# Patient Record
Sex: Male | Born: 1950 | Race: White | Hispanic: No
Health system: Southern US, Community
[De-identification: ages and names within clinical notes are randomized; demographics above are authoritative.]

---

## 2009-06-26 ENCOUNTER — Ambulatory Visit: Payer: Self-pay | Admitting: Oncology

## 2009-06-26 ENCOUNTER — Emergency Department: Payer: Self-pay | Admitting: Emergency Medicine

## 2009-06-26 ENCOUNTER — Ambulatory Visit: Payer: Self-pay | Admitting: Internal Medicine

## 2009-06-27 ENCOUNTER — Emergency Department: Payer: Self-pay | Admitting: Emergency Medicine

## 2009-07-05 ENCOUNTER — Other Ambulatory Visit: Payer: Self-pay | Admitting: Internal Medicine

## 2009-07-09 ENCOUNTER — Other Ambulatory Visit: Payer: Self-pay | Admitting: Internal Medicine

## 2009-07-15 ENCOUNTER — Other Ambulatory Visit: Payer: Self-pay | Admitting: Internal Medicine

## 2009-07-22 ENCOUNTER — Other Ambulatory Visit: Payer: Self-pay | Admitting: Internal Medicine

## 2009-07-24 ENCOUNTER — Ambulatory Visit: Payer: Self-pay | Admitting: Oncology

## 2009-07-26 ENCOUNTER — Ambulatory Visit: Payer: Self-pay | Admitting: Oncology

## 2009-08-26 ENCOUNTER — Ambulatory Visit: Payer: Self-pay | Admitting: Oncology

## 2009-09-26 ENCOUNTER — Ambulatory Visit: Payer: Self-pay | Admitting: Oncology

## 2009-10-26 ENCOUNTER — Ambulatory Visit: Payer: Self-pay | Admitting: Oncology

## 2009-11-12 ENCOUNTER — Emergency Department: Payer: Self-pay | Admitting: Internal Medicine

## 2009-11-26 ENCOUNTER — Ambulatory Visit: Payer: Self-pay | Admitting: Oncology

## 2010-04-16 ENCOUNTER — Ambulatory Visit: Payer: Self-pay | Admitting: Oncology

## 2010-04-27 ENCOUNTER — Ambulatory Visit: Payer: Self-pay | Admitting: Oncology

## 2010-05-27 ENCOUNTER — Ambulatory Visit: Payer: Self-pay | Admitting: Oncology

## 2010-06-27 ENCOUNTER — Ambulatory Visit: Payer: Self-pay | Admitting: Oncology

## 2010-07-27 ENCOUNTER — Ambulatory Visit: Payer: Self-pay | Admitting: Oncology

## 2010-08-11 ENCOUNTER — Ambulatory Visit: Payer: Self-pay

## 2010-08-27 ENCOUNTER — Ambulatory Visit: Payer: Self-pay | Admitting: Oncology

## 2010-09-27 ENCOUNTER — Ambulatory Visit: Payer: Self-pay | Admitting: Oncology

## 2010-10-27 ENCOUNTER — Ambulatory Visit: Payer: Self-pay | Admitting: Oncology

## 2010-11-18 ENCOUNTER — Ambulatory Visit: Payer: Self-pay

## 2010-11-27 ENCOUNTER — Ambulatory Visit: Payer: Self-pay | Admitting: Oncology

## 2010-12-01 ENCOUNTER — Ambulatory Visit: Payer: Self-pay

## 2011-08-12 ENCOUNTER — Inpatient Hospital Stay: Payer: Self-pay | Admitting: Internal Medicine

## 2011-08-12 LAB — CBC
HCT: 26 % — ABNORMAL LOW (ref 40.0–52.0)
HGB: 8.8 g/dL — ABNORMAL LOW (ref 13.0–18.0)
MCHC: 33.9 g/dL (ref 32.0–36.0)
MCV: 83 fL (ref 80–100)

## 2011-08-12 LAB — COMPREHENSIVE METABOLIC PANEL
Albumin: 3.1 g/dL — ABNORMAL LOW (ref 3.4–5.0)
Bilirubin,Total: 0.3 mg/dL (ref 0.2–1.0)
Chloride: 102 mmol/L (ref 98–107)
Co2: 23 mmol/L (ref 21–32)
Creatinine: 1.23 mg/dL (ref 0.60–1.30)
EGFR (African American): >60
EGFR (Non-African Amer.): >60
Osmolality: 317 (ref 275–301)
SGPT (ALT): 19 U/L
Sodium: 136 mmol/L (ref 136–145)
Total Protein: 6.1 g/dL — ABNORMAL LOW (ref 6.4–8.2)

## 2011-08-12 LAB — PROTIME-INR
INR: 4.8
Prothrombin Time: 44.6 secs — ABNORMAL HIGH (ref 11.5–14.7)

## 2011-08-12 LAB — TROPONIN I: Troponin-I: <0.02 ng/mL

## 2011-08-13 LAB — COMPREHENSIVE METABOLIC PANEL
Alkaline Phosphatase: 66 U/L (ref 50–136)
Anion Gap: 14 (ref 7–16)
BUN: 98 mg/dL — ABNORMAL HIGH (ref 7–18)
Calcium, Total: 8.3 mg/dL — ABNORMAL LOW (ref 8.5–10.1)
Chloride: 102 mmol/L (ref 98–107)
Co2: 19 mmol/L — ABNORMAL LOW (ref 21–32)
EGFR (African American): >60
Potassium: 3.8 mmol/L (ref 3.5–5.1)
SGOT(AST): 22 U/L (ref 15–37)
Total Protein: 6.3 g/dL — ABNORMAL LOW (ref 6.4–8.2)

## 2011-08-13 LAB — CBC WITH DIFFERENTIAL/PLATELET
Basophil #: 0 10*3/uL (ref 0.0–0.1)
Basophil %: 0.6 %
Eosinophil %: 0.2 %
HCT: 27.6 % — ABNORMAL LOW (ref 40.0–52.0)
Lymphocyte #: 0.6 10*3/uL — ABNORMAL LOW (ref 1.0–3.6)
Lymphocyte %: 7.5 %
MCV: 83 fL (ref 80–100)
Monocyte %: 6.3 %
Neutrophil #: 6.5 10*3/uL (ref 1.4–6.5)
Neutrophil %: 85.4 %
RDW: 15.1 % — ABNORMAL HIGH (ref 11.5–14.5)

## 2011-08-13 LAB — PROTIME-INR
INR: 1.2
Prothrombin Time: 15.2 secs — ABNORMAL HIGH (ref 11.5–14.7)

## 2011-08-13 LAB — CK TOTAL AND CKMB (NOT AT ARMC): CK, Total: 84 U/L (ref 35–232)

## 2011-08-13 LAB — TROPONIN I: Troponin-I: <0.02 ng/mL

## 2011-08-13 LAB — MAGNESIUM: Magnesium: 1.5 mg/dL — ABNORMAL LOW

## 2011-08-14 LAB — PROTIME-INR: INR: 0.9

## 2011-09-02 ENCOUNTER — Ambulatory Visit: Payer: Self-pay | Admitting: Oncology

## 2011-09-02 LAB — IRON AND TIBC: Iron: 48 ug/dL — ABNORMAL LOW (ref 65–175)

## 2011-09-02 LAB — CBC CANCER CENTER
Basophil #: 0 x10 3/mm (ref 0.0–0.1)
Eosinophil #: 0.1 x10 3/mm (ref 0.0–0.7)
HCT: 32.5 % — ABNORMAL LOW (ref 40.0–52.0)
Lymphocyte #: 3.8 x10 3/mm — ABNORMAL HIGH (ref 1.0–3.6)
MCH: 27 pg (ref 26.0–34.0)
MCHC: 32.5 g/dL (ref 32.0–36.0)
MCV: 83 fL (ref 80–100)
Monocyte #: 0.7 x10 3/mm (ref 0.2–1.0)
Monocyte %: 6.3 %
Platelet: 345 x10 3/mm (ref 150–440)
RDW: 16.2 % — ABNORMAL HIGH (ref 11.5–14.5)
WBC: 10.3 x10 3/mm (ref 3.8–10.6)

## 2011-09-16 LAB — CBC CANCER CENTER
Basophil #: 0.1 x10 3/mm (ref 0.0–0.1)
Eosinophil #: 0.1 x10 3/mm (ref 0.0–0.7)
Eosinophil %: 0.7 %
HCT: 37.2 % — ABNORMAL LOW (ref 40.0–52.0)
Lymphocyte #: 3.2 x10 3/mm (ref 1.0–3.6)
Lymphocyte %: 27.8 %
MCH: 26.8 pg (ref 26.0–34.0)
MCHC: 32.5 g/dL (ref 32.0–36.0)
MCV: 82 fL (ref 80–100)
Monocyte #: 0.9 x10 3/mm (ref 0.2–1.0)
Monocyte %: 7.7 %
Neutrophil %: 63.2 %
Platelet: 311 x10 3/mm (ref 150–440)
RBC: 4.53 10*6/uL (ref 4.40–5.90)
RDW: 16 % — ABNORMAL HIGH (ref 11.5–14.5)
WBC: 11.6 x10 3/mm — ABNORMAL HIGH (ref 3.8–10.6)

## 2011-09-27 ENCOUNTER — Ambulatory Visit: Payer: Self-pay | Admitting: Oncology

## 2012-02-14 IMAGING — US US EXTREM LOW VENOUS*L*
1 series · 17 of 24 positions shown · non-contrast
Comparison: none

REASON FOR EXAM: cr [id number] pain and swelling left calf hist of dvt
eval for dvt
COMMENTS:

PROCEDURE:     US  - US DOPPLER LOW EXTR LEFT  - November 18, 2010  [DATE]
RESULT:     This study was compared to previous study dated 04/16/2010.
TECHNIQUE: Grayscale, color flow, and duplex Doppler and SPECTRAL waveform
imaging was performed of the deep venous structures of the left lower
extremity.

[Series 1: us extrem low venous*left* · 17 of 49 slices shown]
[im 1/49]
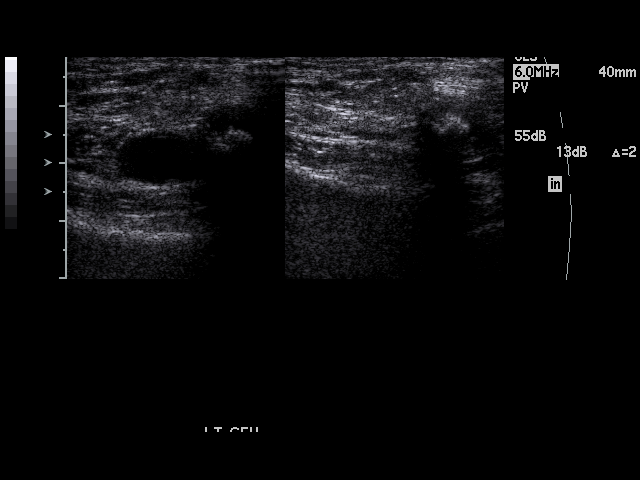
[im 5/49]
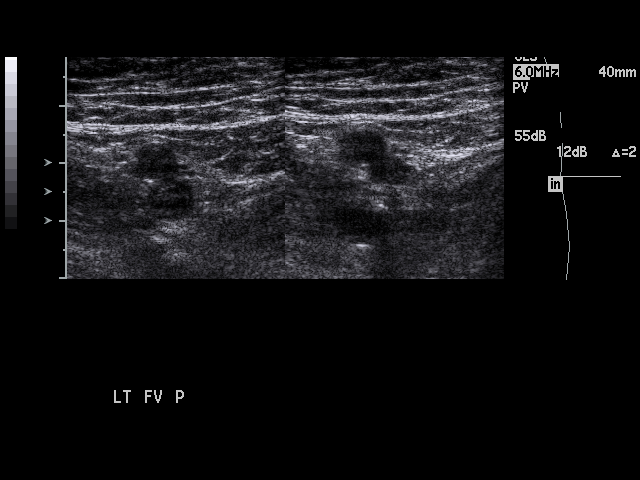
[im 7/49]
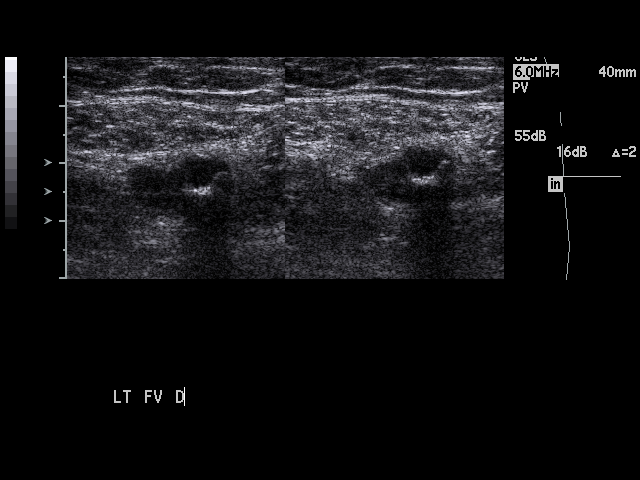
[im 9/49]
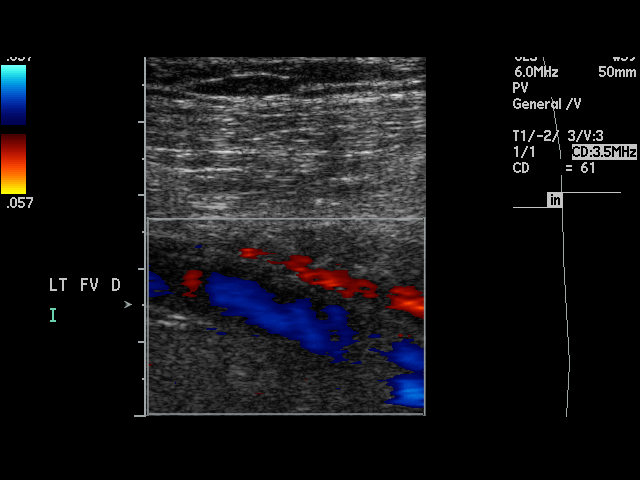
[im 13/49]
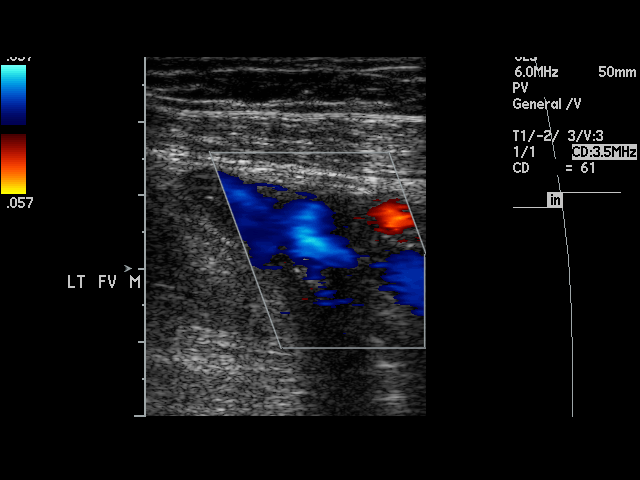
[im 15/49]
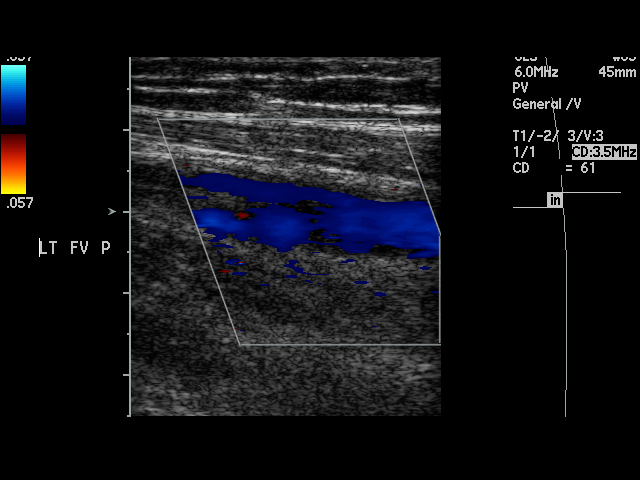
[im 19/49]
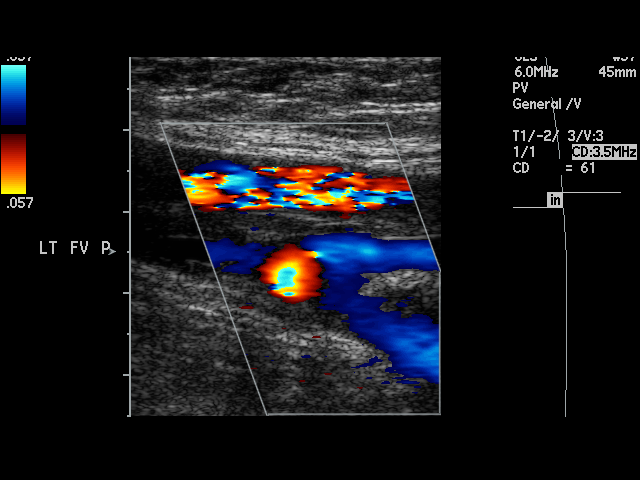
[im 21/49]
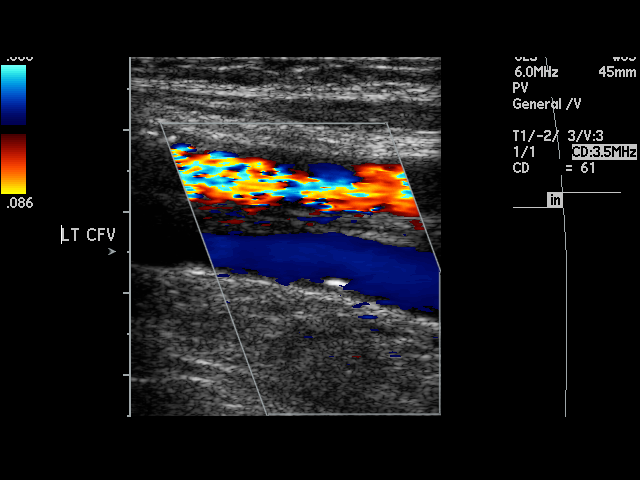
[im 26/49]
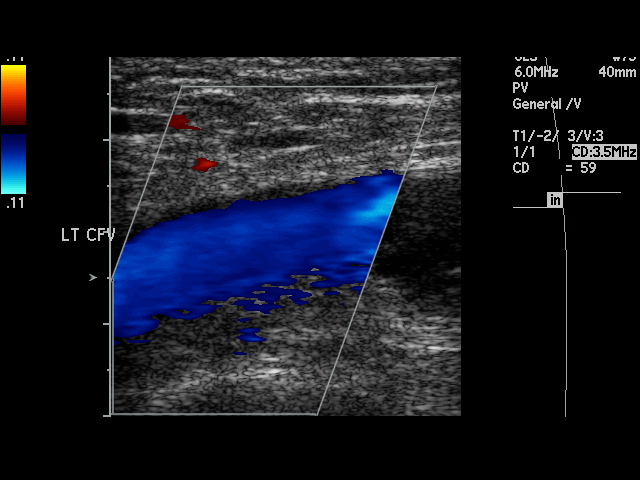
[im 28/49]
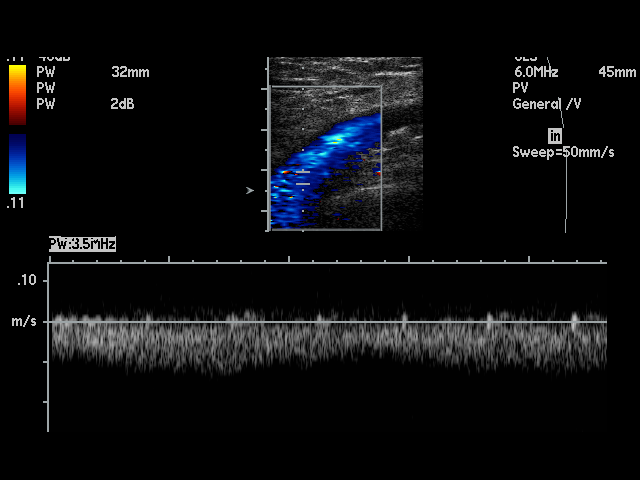
[im 30/49]
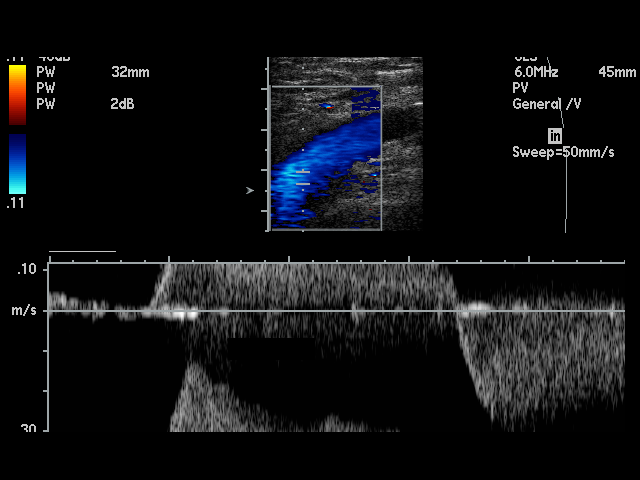
[im 34/49]
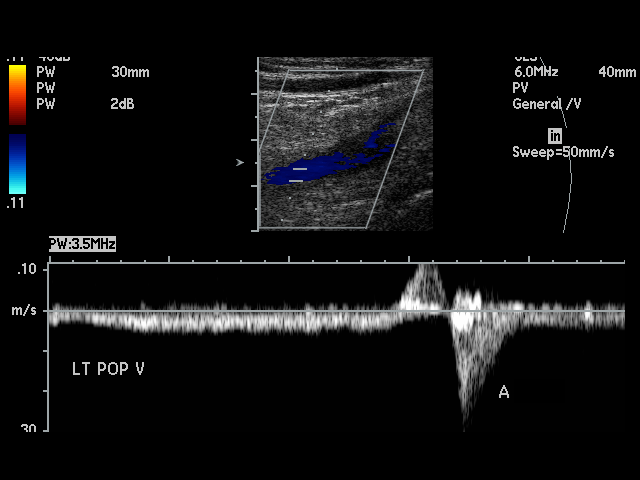
[im 36/49]
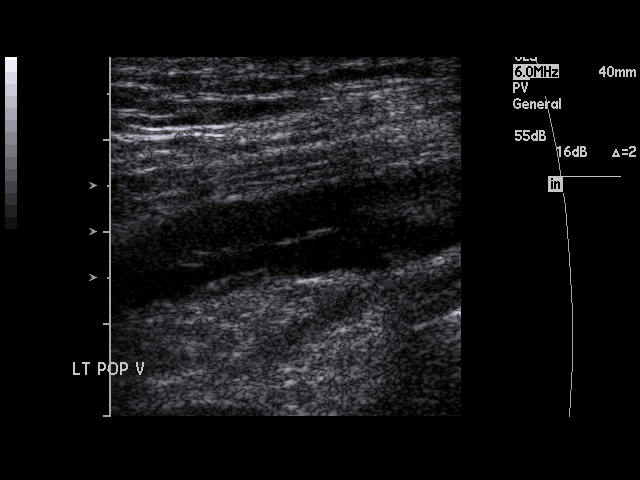
[im 40/49]
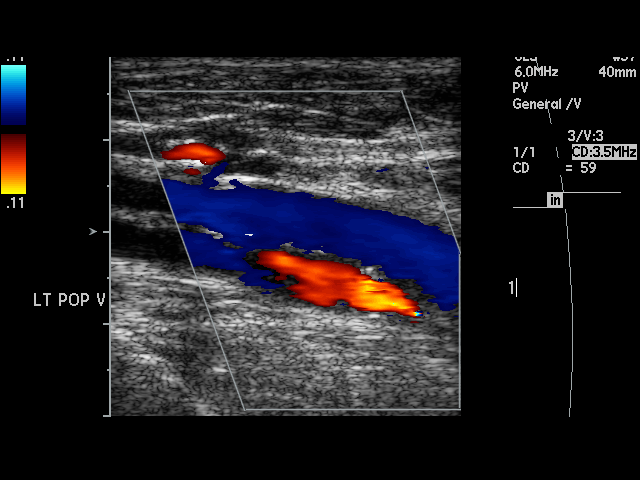
[im 42/49]
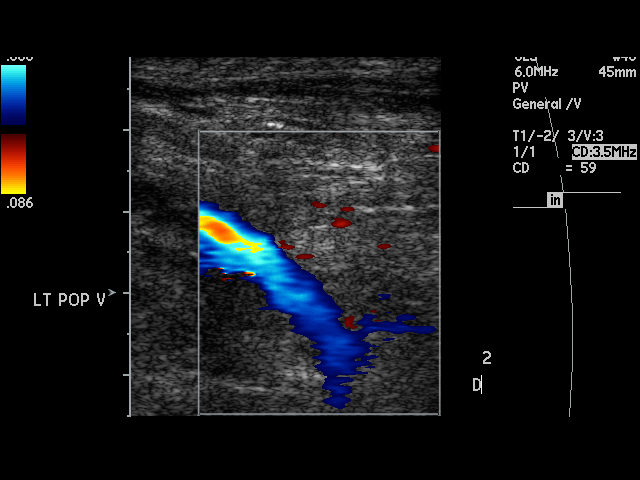
[im 44/49]
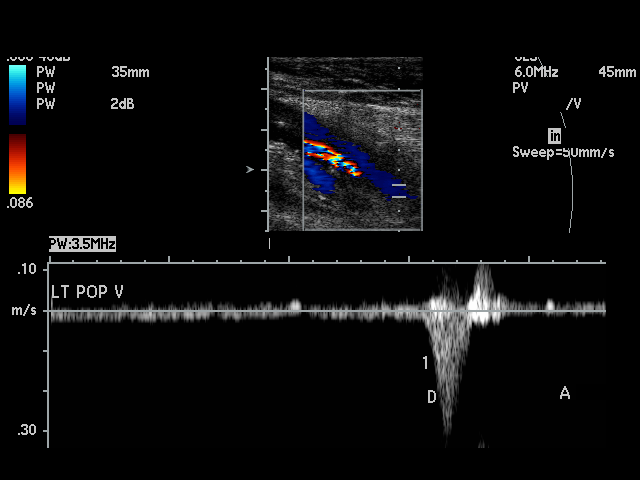
[im 49/49]
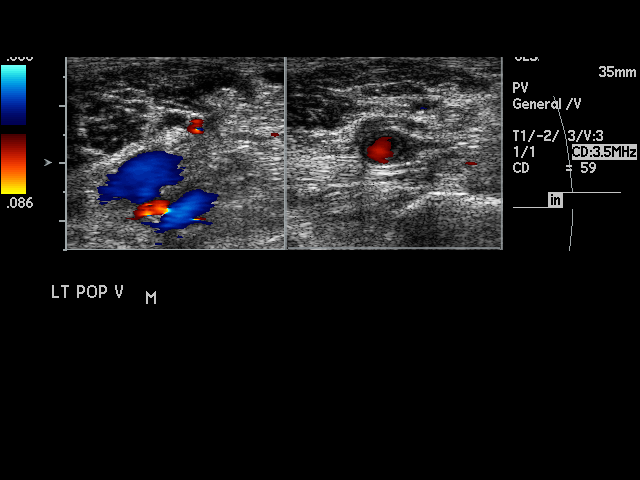

[17 of 24 positions shown; findings below may reference images not displayed]

FINDINGS: Scattered areas of mural echogenic thrombus are identified within
the distal common femoral vein throughout the superficial femoral veins and
within the proximal popliteal vein. The common femoral vein distally and
superficial femoral vein demonstrate partial compression. There is
near-complete compression of the popliteal vein. When compared to the
previous study, these areas are consistent with residual chronic thrombus.
The previously described occlusive thrombus within the common femoral and
superficial femoral veins has near completely resolved. Color flow and
SPECTRAL waveform imaging is not appreciated within these regions. The
popliteal vein is unremarkable demonstrating appropriate color flow,
SPECTRAL waveform imaging as well as appropriate response to augmentation.
IMPRESSION: Marked resolution of the previously described extensive deep venous thrombus
within the left lower extremity. Residual multifocal areas of nonocclusive
mural thrombus are appreciated as described above.

## 2012-10-17 ENCOUNTER — Other Ambulatory Visit: Payer: Self-pay | Admitting: Family Medicine

## 2012-10-17 LAB — URINALYSIS, COMPLETE
Bacteria: NONE SEEN
Bilirubin,UR: NEGATIVE
Glucose,UR: NEGATIVE mg/dL (ref 0–75)
Nitrite: NEGATIVE
Protein: 100
Specific Gravity: 1.024 (ref 1.003–1.030)
Squamous Epithelial: 1
WBC UR: 123 /HPF (ref 0–5)

## 2012-11-07 IMAGING — CR DG ABDOMEN 3V
1 series · 5 of 5 positions shown · non-contrast
Comparison: none

REASON FOR EXAM: abd pain
COMMENTS:

[Series 1: w chest ap · 0.14mm/px · 5 of 5 slices shown]
[im 1/5]
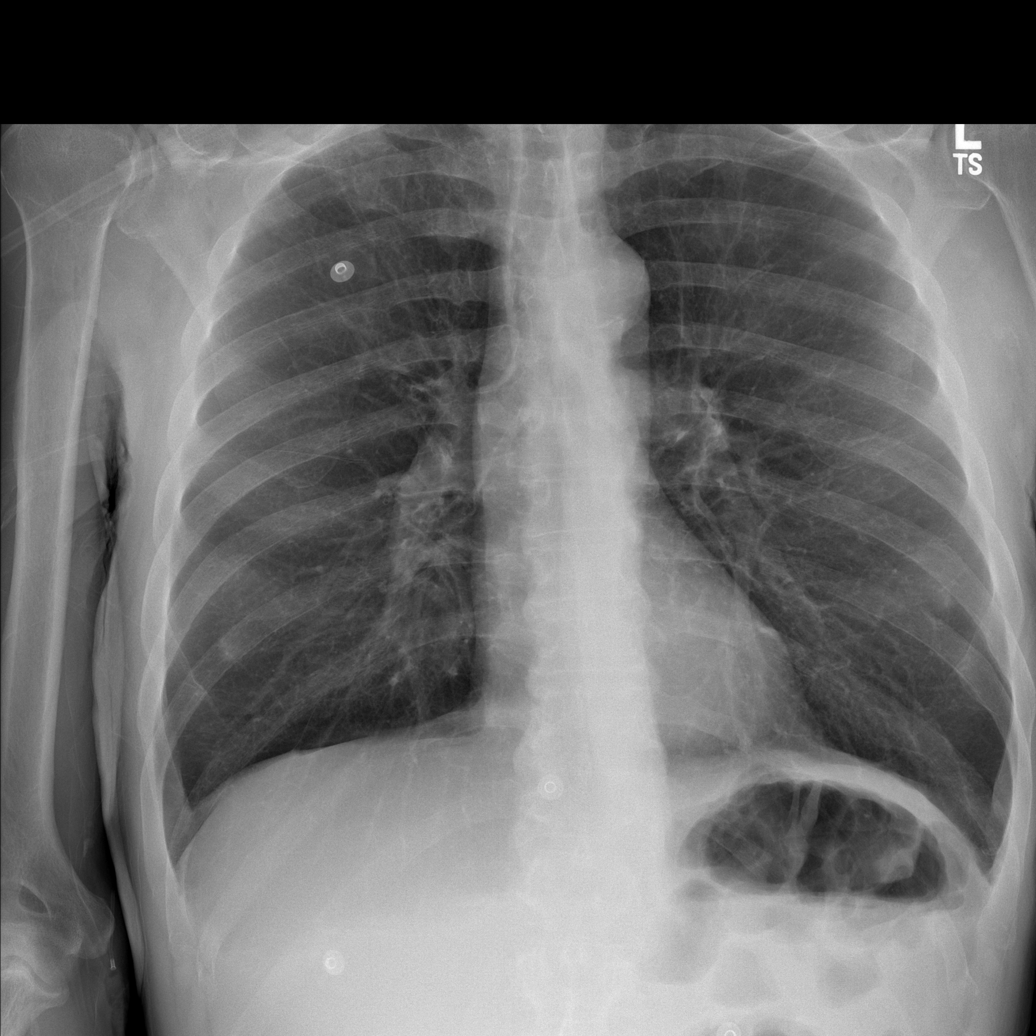
[im 2/5]
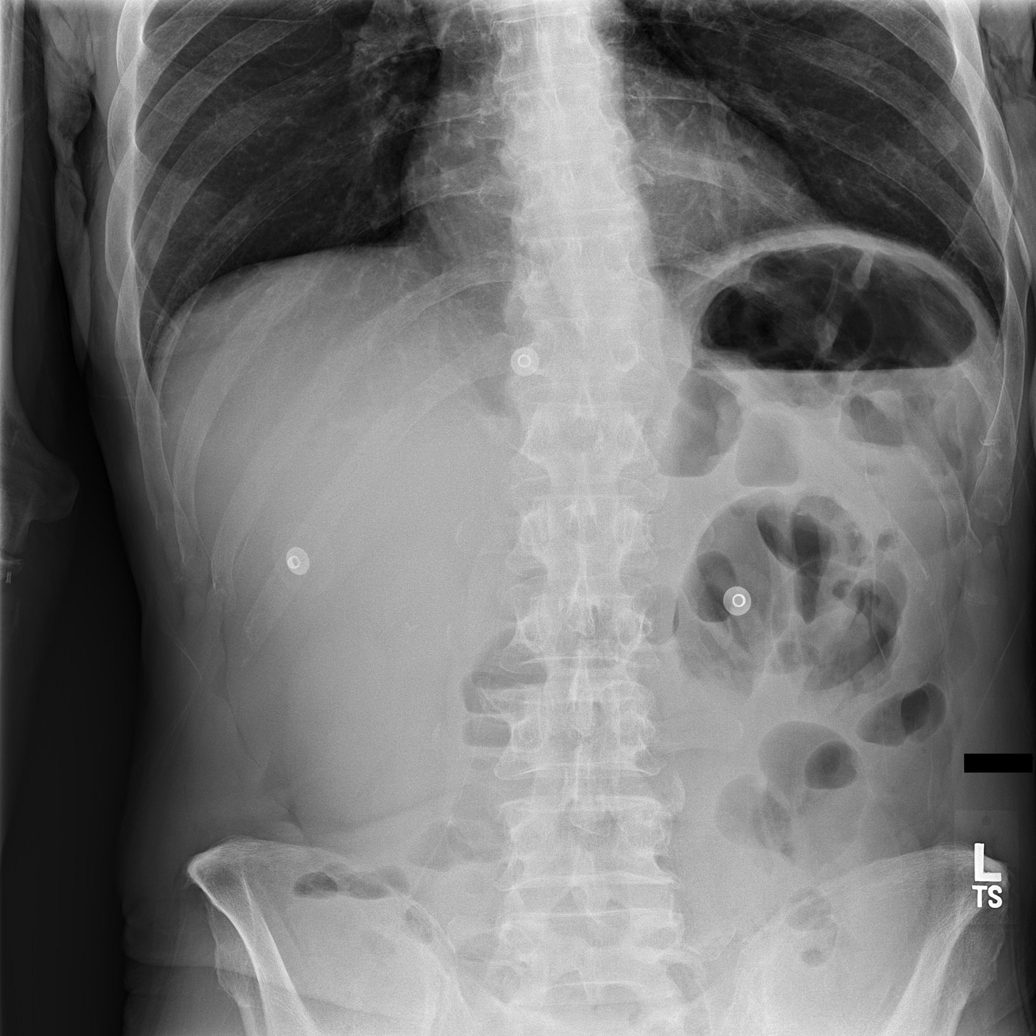
[im 3/5]
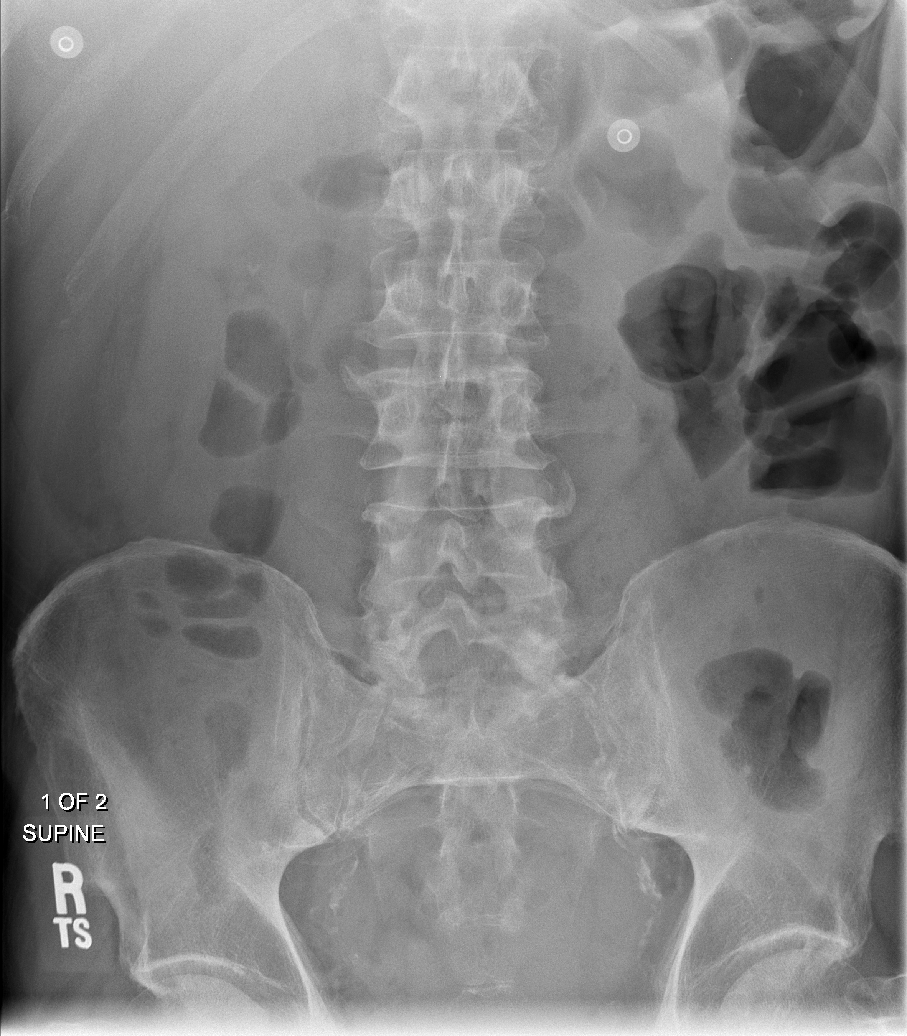
[im 4/5]
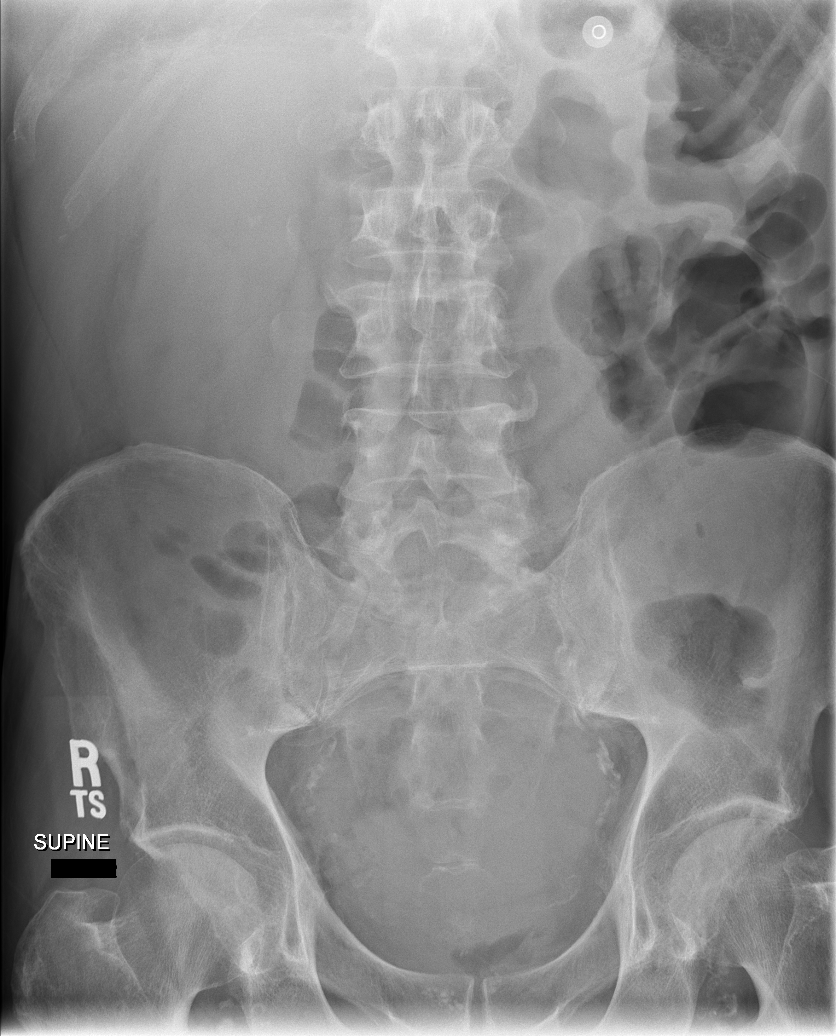
[im 5/5]
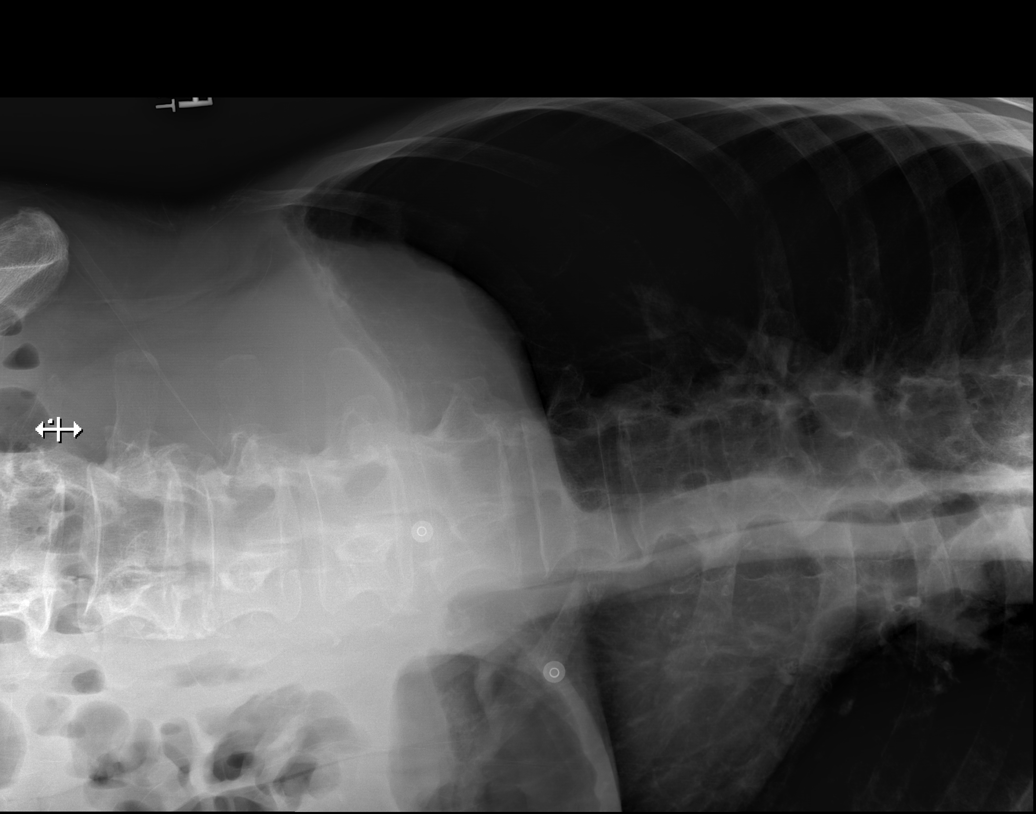

[5 of 5 positions shown; findings below may reference images not displayed]

PROCEDURE:     DXR - DXR ABDOMEN 3-WAY (INCL PA CXR)  - August 12, 2011 [DATE]

RESULT:     Comparison is made to a chest x-ray December 01, 2010.

The lungs remain hyperinflated. There is no focal infiltrate. The cardiac
silhouette is normal in size. The mediastinum is normal in width.

Within the abdomen there are loops of partially distended gas filled small
and large bowel demonstrated. The pattern suggest a mild ileus or
gastroenteritis process rather than infection. No free extraluminal gas
collections are demonstrated. There are mild degenerative changes of the
lumbar spine. Vascular calcifications are present in the pelvis.
IMPRESSION: The bowel gas pattern is relatively nonspecific. There is
no evidence of obstruction or perforation. Certainly diverticulitis could be
present in the setting of this bowel gas pattern. Followup abdominal and
pelvic CT scanning may be of value.

## 2012-11-08 IMAGING — US ABDOMEN ULTRASOUND
1 series · 13 of 25 positions shown · non-contrast
Comparison: none

REASON FOR EXAM: Hx of ETOH abuse presenting with ARAZION
COMMENTS:

[Series 1: abdomen ultrasound · 0.20mm/px · 13 of 96 slices shown]
[im 1/96]
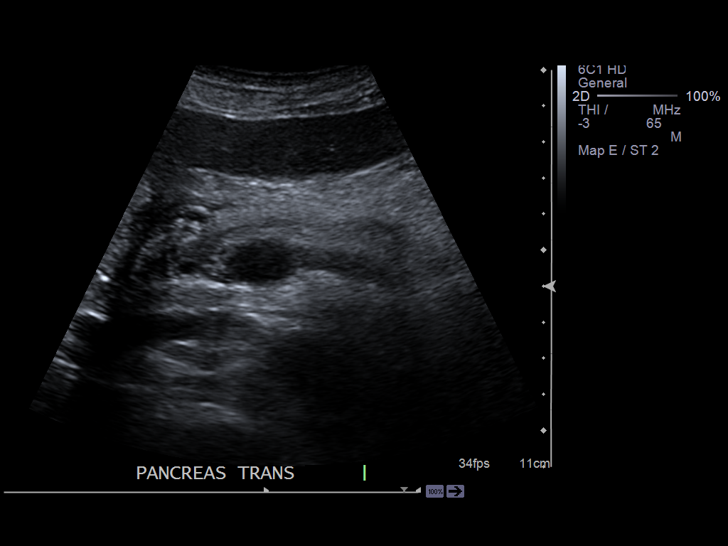
[im 8/96]
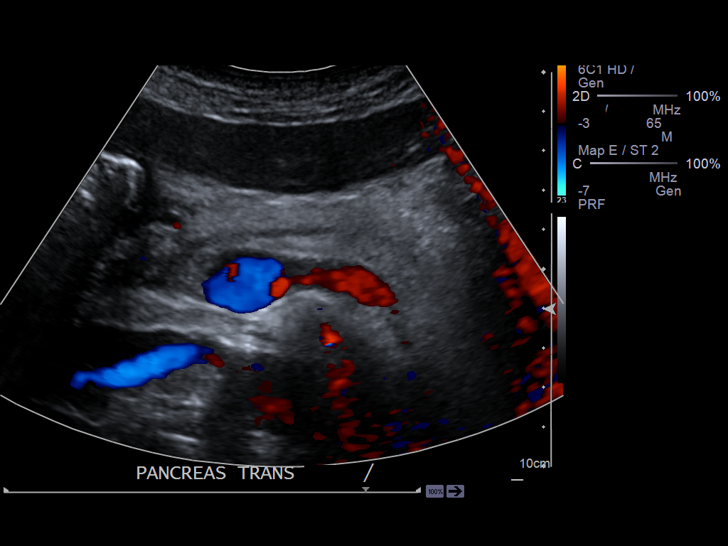
[im 16/96]
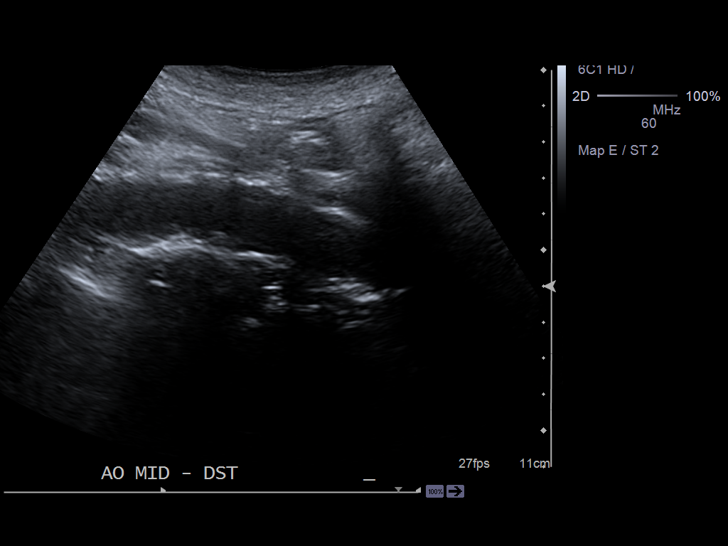
[im 24/96]
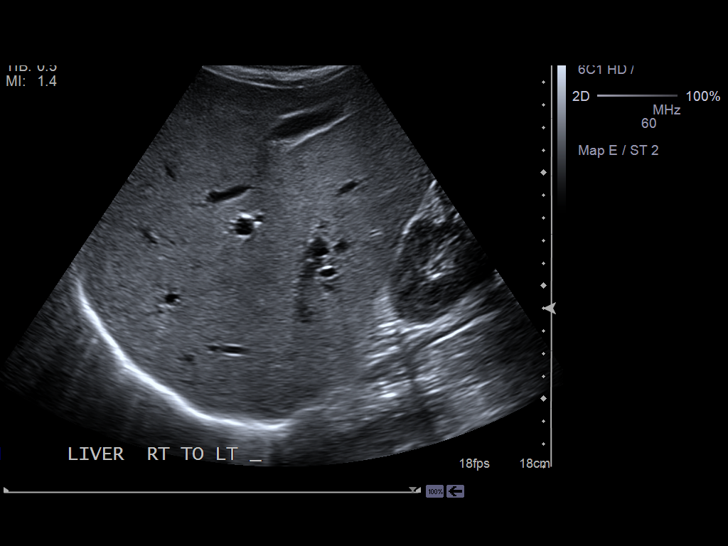
[im 32/96]
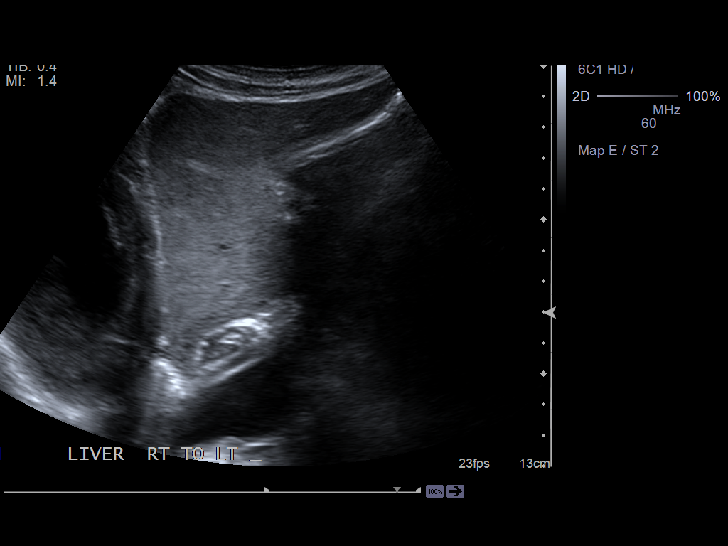
[im 40/96]
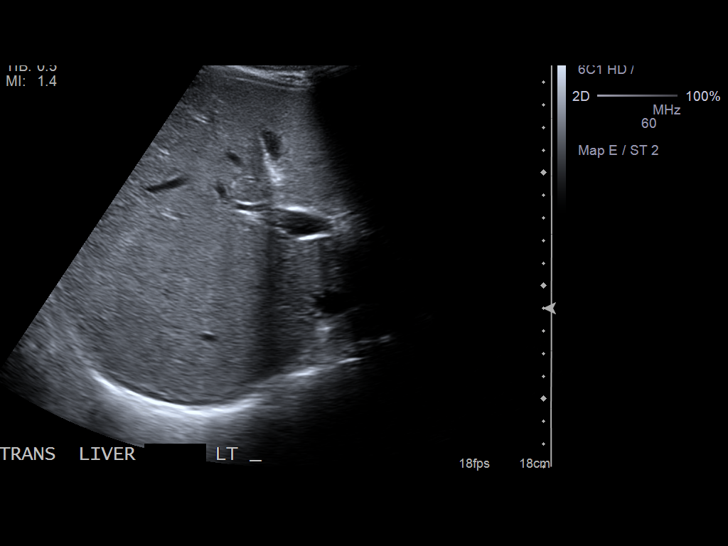
[im 48/96]
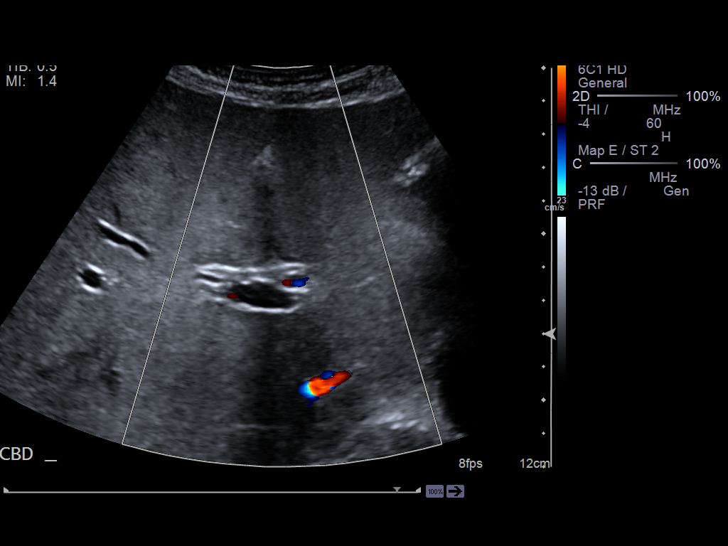
[im 56/96]
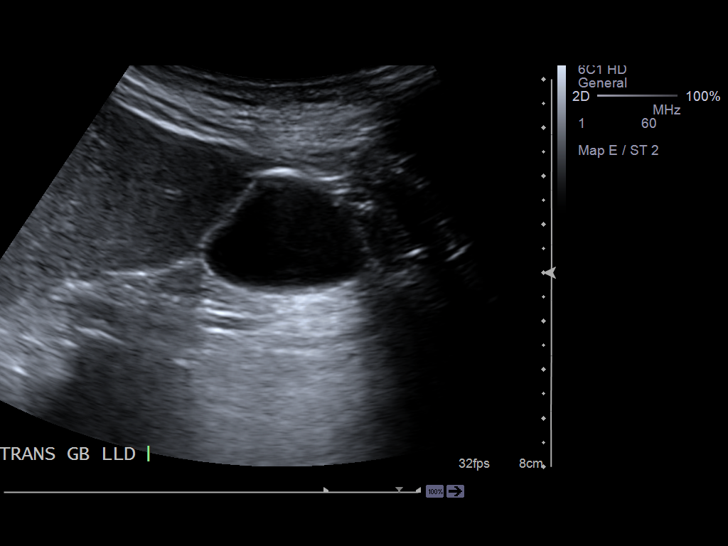
[im 64/96]
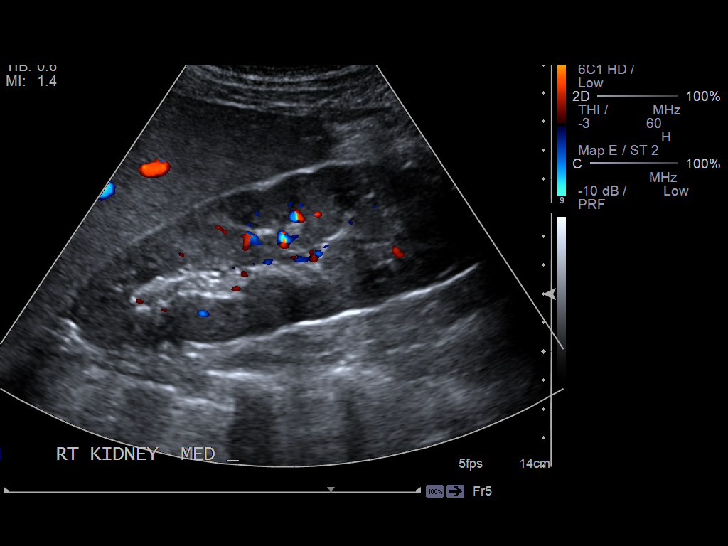
[im 72/96]
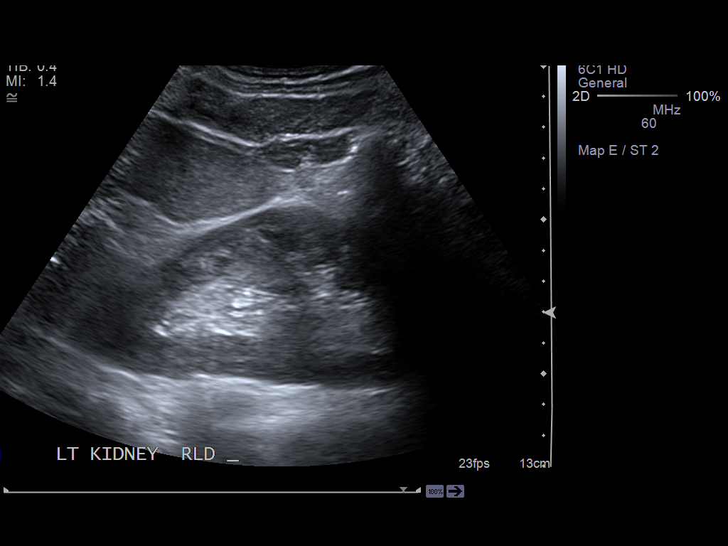
[im 80/96]
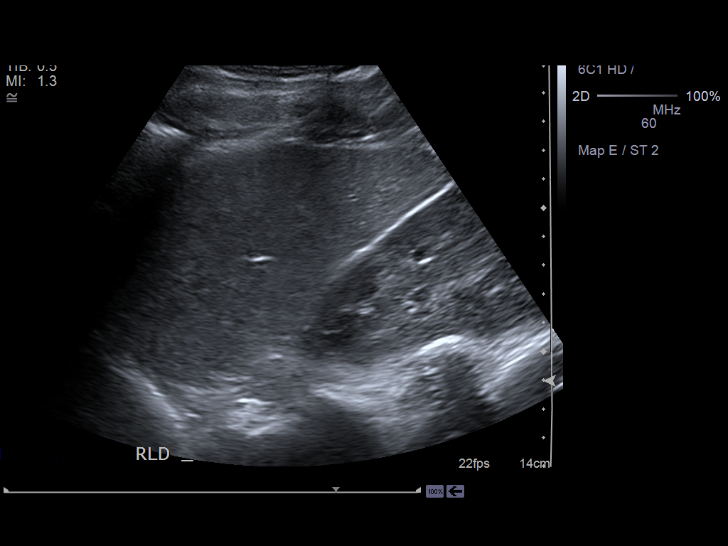
[im 88/96]
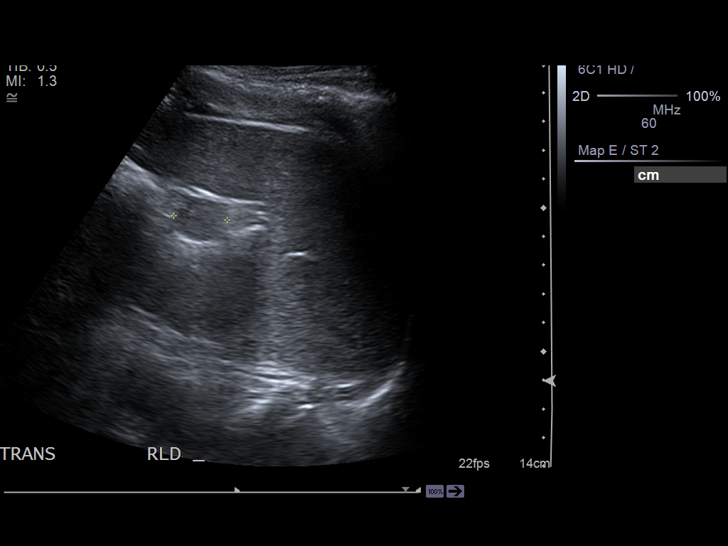
[im 96/96]
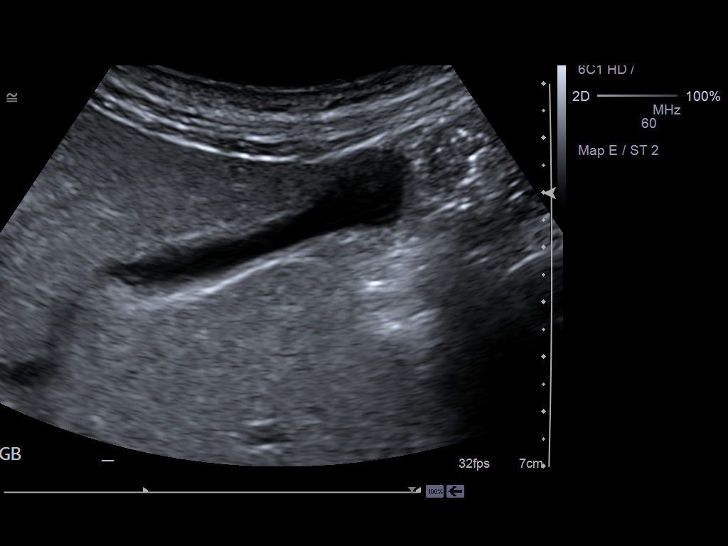

[13 of 25 positions shown; findings below may reference images not displayed]

PROCEDURE:     US  - US ABDOMEN GENERAL SURVEY  - August 13, 2011 [DATE]

RESULT:     The liver exhibits normal echotexture with no evidence of mass
or ductal dilation. Portal venous flow is normal in direction toward the
liver. There is no evidence of ascites. The gallbladder is only partially
distended. There is no evidence of stones or wall thickening or
pericholecystic fluid. There is no positive sonographic Murphy's sign. The
common bile duct is normal at 3 mm in diameter. Evaluation of the pancreas
is limited due to bowel gas. The spleen is mildly enlarged at 13.1 cm and
there is a small accessory spleen. The abdominal aorta exhibits mural
thrombus. The maximal measured diameter of the aorta is 2.5 cm. The observed
portions of the kidneys exhibit no acute abnormality.
IMPRESSION: 1. No acute abnormality of the liver or gallbladder is demonstrated.
2. There is mild splenomegaly. There is a small accessory spleen as well.
3. Evaluation of the pancreas was limited due to bowel gas.
4. There is mural thrombus within the abdominal aorta in its mid and distal
portion.
5. No acute abnormality of either kidney is demonstrated.
6. There is no evidence of ascites.

## 2012-11-11 ENCOUNTER — Other Ambulatory Visit: Payer: Self-pay | Admitting: Family Medicine

## 2012-11-11 LAB — URINALYSIS, COMPLETE
Glucose,UR: NEGATIVE mg/dL (ref 0–75)
Ph: 5 (ref 4.5–8.0)
Protein: 30
RBC,UR: 1 /HPF (ref 0–5)
Squamous Epithelial: 1
WBC UR: 5 /HPF (ref 0–5)

## 2012-12-12 ENCOUNTER — Other Ambulatory Visit: Payer: Self-pay | Admitting: Family Medicine

## 2012-12-12 LAB — URINALYSIS, COMPLETE
Bacteria: NONE SEEN
Bilirubin,UR: NEGATIVE
Glucose,UR: NEGATIVE mg/dL (ref 0–75)
Ketone: NEGATIVE
Ph: 5 (ref 4.5–8.0)
RBC,UR: 1 /HPF (ref 0–5)
Squamous Epithelial: 2
WBC UR: 6 /HPF (ref 0–5)

## 2013-01-26 DEATH — deceased

## 2014-05-15 NOTE — Consult Note (Signed)
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   Patient was seen by consultant.   Consult note dictated.   Recommend to proceed with surgery or procedure.   Recommend further assessment or treatment.   Orders entered.   Discussed with Attending MD.   Comments: Psychiatry: Patient seen. He has a lifelong "clautrophobia" esp of hospitals or of beiing out of control of his movements. He does not endorse depression, SI or show signs of psychosis or delirium. I explained GI bleeds to him and that without proper evaluation and treatment they can be rapidly lifethreatening and that it is nnot safe for him to go home until this is sorted. Fortunately, that can likely be done with endoscopy today. Which he agrees to. I ordered now iv 5mg  valium and prn 10mg  q6 to help him calm down for now. If he starts to insist on leaving before  providers think it safe, we would be in a position to file IVC, but hopefully that wont be necessary.  Electronic Signatures: Audery Amellapacs, Stephaney Steven T (MD)  (Signed 18-Jul-13 11:06)  Authored: Brief Consult Note   Last Updated: 18-Jul-13 11:06 by Audery Amellapacs, Momoko Slezak T (MD)
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
## 2014-05-15 NOTE — Consult Note (Signed)
Full consult to follow. Pt with syncope, melena, low hgb. Hx of EtOH abuse in the past. Feels better with blood transfusions. Wants to go home. Feels claustrophobic. Discussed scheduling EGD today. Initially, refused but after family members convinced him, now willing to get EGD later today. Hopefully, patient will not change his mind before then. Thanks.  Electronic Signatures: Lutricia Feilh, Dawson Albers (MD)  (Signed on 18-Jul-13 14:51)  Authored  Last Updated: 18-Jul-13 14:51 by Lutricia Feilh, Thanvi Blincoe (MD)

## 2014-05-15 NOTE — H&P (Signed)
PATIENT NAME:  Jacob Ellis, Jacob Ellis MR#:  [id number] DATE OF BIRTH:  Jan 30, 1950  DATE OF ADMISSION:  08/13/2011  REFERRING PHYSICIAN: Dr. Mindi Junker   PRIMARY PHYSICIAN: [facility]    PRESENTING COMPLAINT: Syncope 2 to 3 times with black tarry stool, generalized weakness.   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 He reports development of shortness of breath both at rest and on exertion also for the same time period. He endorses 2 to 3 episodes of syncope when getting up from a sitting position, reports he would get dizzy, lose consciousness for a few seconds. The patient did not want to come in to be evaluated but his family "forced him come in". He reports chronic abdominal pain occasionally after eating for the past two years and also after he eats he tries to force himself to vomit. He has not noticed any blood in the past and reports black tarry stool may have begun today, unsure. He denies any chest pain otherwise. No palpitations.   PAST MEDICAL HISTORY:  1. Diabetes.  2. Hypertension.  3. History of heavy alcohol abuse, quit approximately 05/30/2009.  4. Tobacco abuse.  5. 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  6. Depression.  7. History of heterozygote Factor V Leiden.   PAST SURGICAL HISTORY: Appendectomy.   ALLERGIES: No known drug allergies.   MEDICATIONS:  1. Amlodipine 10 mg daily.  2. Carvedilol 6.25 mg b.i.d.  3. Coumadin 20 mg daily. His prescription bottle is written as warfarin 10 mg take 1 to 2 tablets by mouth every day for blood  clot. 4. Gabapentin 200 mg b.i.d.  5. Glyburide/metformin 5/500 mg b.i.d.  6. Lisinopril 40 mg daily.  7. Norco 5/325 t.i.d. as needed   SOCIAL HISTORY: He lives in [place] alone. He smoked 2 packs per day for over 20 years. No alcohol currently, again quit approximately two years ago.   FAMILY HISTORY: Brother died of dementia. Another brother committed suicide in May 30, 2009. Mother died of ruptured aortic aneurysm, had history of TIA. Father had heart disease.   REVIEW OF SYSTEMS: CONSTITUTIONAL: No fevers. He endorses nausea and history of inducing vomiting. EYES: No glaucoma or cataracts. ENT: No epistaxis, discharge, or gingival bleed. RESPIRATORY: He endorses shortness of breath. No cough or hemoptysis. CARDIOVASCULAR: As per history of present illness. GI: Endorses forced vomiting and chronic abdominal pain. Denies hematemesis but reports black tarry stool as per history of present illness. GU: No dysuria or hematuria. ENDOCRINE: No polyuria or polydipsia. HEME: No bleeding elsewhere except for GI and reports easy bruising. SKIN: No ulcers. MUSCULOSKELETAL: No joint swelling. NEUROLOGIC: No history of stroke or seizure. PSYCH: He endorses depression and passive suicidal ideation but reports that he would never act on his thoughts.   PHYSICAL EXAMINATION:   VITAL SIGNS: Temperature 98.6, pulse 101, respiratory rate 20, blood pressure 104/68, sating at 100% on 2 liters. The patient does have positive orthostasis and his lowest blood pressure reading was 87/59.   GENERAL: Lying in bed in no apparent distress.   HEENT: Normocephalic,  atraumatic. Pupils are equal and symmetric. He has pale conjunctivae. Nares with nasal cannula in place. He has slightly dry mucous membrane.   NECK: Soft and supple. No adenopathy or JVP.   CARDIOVASCULAR: Slightly tachy. No murmurs, rubs, or gallops.   LUNGS: Clear to auscultation bilaterally. No use of accessory muscles or increased respiratory effort.    ABDOMEN: Soft. Positive bowel sounds. No ascites appreciated. His liver and spleen feel normal in size.   EXTREMITIES: No edema. Dorsal pedis pulses intact.   MUSCULOSKELETAL: No joint effusion.   SKIN: He has some pallor to his skin.   PSYCH: He is alert and oriented and minimally cooperative.   PERTINENT LABS AND STUDIES: Glucose 381, BUN 96, creatinine 1.23, sodium 136, potassium 4.1, chloride 102, carbon dioxide 23, calcium 8.1. LFTs within normal limits. Total protein 6.1, albumin 3.1, WBC 10.8, hemoglobin 8.8, hematocrit 26, platelets 149, MCV 83. INR 4.8. PT 44.6. Troponin less than 0.02.   EKG with sinus rate of 96. No ST elevation or depression. There is T wave inversion in aVR.   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  1. Syncope in the setting of orthostatic hypotension and symptomatic anemia. Anemia is likely in the setting of GI bleed with acquired coagulopathy and supratherapeutic INR. Plan for transfusion status post Vitamin K in the ED. Consider repeat Vitamin K and plasma if recurrent bleed. Follow INR. Hold Coumadin for now. Hold blood pressure meds. Continue serial hemoglobin. Continue IV fluids. Keep patient n.p.o. except for meds.  2. History of heavy alcohol abuse. Will get abdominal ultrasound. Start empirically on octreotide. Start Protonix drip. Obtain GI consultation.  3. Hypertension with hypotension as above. IV fluids and holding amlodipine, carvedilol, and lisinopril.  4. Diabetes. Start sliding scale insulin. Hold glyburide and metformin while n.p.o.  5. Depression, treated with passive suicidal ideation. Obtain Psych consultation.  6. Tobacco abuse. The patient refuses nicotine patch.   7. Prophylaxis with TEDs, SCDs, and Protonix drip.   TIME SPENT: Approximately 50 minutes spent on patient care.    ____________________________ Reuel DerbyAlounthith Robt Okuda, MD ap:drc D: 08/13/2011 00:15:46 ET T: 08/13/2011 07:20:16 ET JOB#: [id number]  cc: Pearlean BrownieAlounthith Nikalas Bramel, MD, <Dictator> Reuel DerbyALOUNTHITH Leilani Cespedes MD ELECTRONICALLY SIGNED 08/15/2011 0:48

## 2014-05-15 NOTE — Discharge Summary (Signed)
PATIENT NAME:  Jacob Ellis, Jacob Ellis MR#:  [id number] DATE OF BIRTH:  08-Jun-1950  DATE OF ADMISSION:  08/12/2011 DATE OF DISCHARGE:  08/14/2011  PRESENTING COMPLAINT: Syncope 2 to 3 times with black, tarry stools.   DISCHARGE DIAGNOSES:  1. Melena due to slow upper gastrointestinal bleed due to peptic ulcer disease in the setting of acquired coagulopathy.  2. History of heterozygous factor V Leiden deficiency.  3. History of pulmonary embolus and deep vein thrombosis.  4. History of hypertension.  5. Type 2 diabetes.   PROCEDURES: Esophagogastroduodenoscopy showed cratered nonbleeding ulcers in the stomach and duodenum.   CODE STATUS: FULL CODE.   MEDICATIONS:  1. Coumadin 20 mg p.o. daily.  2. Glyburide/metformin 5/500, one tablet twice a day. 3. Gabapentin 200 mg b.i.d.  4. Lisinopril 40 mg p.o. daily.  5. Amlodipine 10 mg daily.  6. Carvedilol 6.25 b.i.d.  7. Norco 5/325 one tablet 3 times a day as needed.  8. Protonix 40 mg b.i.d.  9. Carafate 1 tablet 4 times a day for 15 days.   DIET: Low sodium, low fat, low cholesterol. Mechanical soft diet.   FOLLOWUP:  1. Follow up with Oliver HumHeather Hiles, NP, [facility], on Monday, 07/22 at 9:10 a.m. to get PT-INR checked.  2. Follow up with Owens Sharkawn Harrison 08/28/2011 at 9:00 a.m.   LABORATORY, RADIOLOGICAL AND DIAGNOSTIC DATA: PT-INR 12.9 and 0.9. Hemoglobin is 8.1. Cardiac enzymes negative. Abdominal ultrasound shows mural thrombus within the abdominal aorta in its mid and distal portion. No acute abnormalities of the kidneys noted. No abnormality of the liver or gallbladder is noted. White count 7.6. LFTs within normal limits. INR was 4.8.   CONSULTATION: Gastroenterology consultation with Dr. Bluford Kaufmannh.   BRIEF SUMMARY OF HOSPITAL COURSE: Mr. Jacob Ellis is a [age] year old gentleman with history of alcohol abuse, quit two years ago, tobacco abuse, depression, heterozygous factor V Leiden with history of deep venous thrombosis and pulmonary  emboli on lifelong Coumadin comes in with:  1. Syncopal episode in the setting of orthostatic hypotension, symptomatic anemia with melanotic stools. The patient was admitted in the Critical Care Unit. He received 1 unit of blood transfusion. His hemoglobin was 8.8 on admission and received one unit of blood transfusion. He did not have any active bleeding. His Coumadin was held. Since his INR is 4.8, he received one dose of vitamin K. He was started on Protonix and octreotide drip. He was seen by Dr. Bluford Kaufmannh who recommended an esophagogastroduodenoscopy and results as above were noted. He was changed to p.o. Protonix and Carafate. His Coumadin was resumed after discussing with Dr. Bluford Kaufmannh since the ulcers were not bleeding and his risk of having further DVTs and PE are way more than his risk of gastrointestinal bleeding at this time. The patient will follow up with Oliver HumHeather Hiles, NP, on Monday 07/22 for his PT-INR check. 2. Acquired coagulopathy, being on Coumadin. [facility], and his Coumadin has been resumed to 20 mg, which he used to take as before.  3. History of ex-heavy alcohol abuse. Ultrasound of the abdomen just showed mild splenomegaly and mural thrombus in the mid abdominal aorta.  4. Hypertension. He is on amlodipine, carvedilol and Lisinopril which we were continued.  5. Diabetes. Resume glyburide/metformin.  6. Anxiety with passive suicidal threats as per family members. However, the patient was seen by Dr. Toni Amendlapacs and per his interpretation, the patient has more anxiety and claustrophobia. No true suicidal ideation. He was  under involuntary commitment. The patient was okay to be discharged to home per Dr. Toni Amend.   Hospital stay otherwise remained stable. The patient remained a FULL CODE.     TIME SPENT: 40 minutes.  ____________________________ Wylie Hail Allena Katz, MD sap:ap D: 08/14/2011 14:32:20 ET Ellis: 08/15/2011 14:58:25 ET JOB#: [id number]  cc: Deaisa Merida  A. Allena Katz, MD, <Dictator> Oliver Hum, NP Rodman Key, NP Willow Ora MD ELECTRONICALLY SIGNED 08/24/2011 13:15

## 2014-05-15 NOTE — Consult Note (Signed)
PATIENT NAME:  Jacob Ellis, Jacob Ellis MR#:  [id number] DATE OF BIRTH:  1950-02-21  DATE OF CONSULTATION:  08/13/2011  REFERRING PHYSICIAN:   CONSULTING PHYSICIAN:  Ezzard Standing. Neera Teng, MD  REASON FOR REFERRAL: Melena, anemia due to blood loss.   HISTORY OF PRESENT ILLNESS: Mr. Dudzinski is a 64 year old white male with a history of tobacco use and distant alcohol abuse. 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 He was brought to the Emergency Room by family members because of syncopal episodes as well as some vague abdominal pain and melena. He has been taking some Alka-Seltzers for abdominal pain. It is unclear how long he has had melena.   When I saw him, he already received blood transfusion overnight and was feeling better and he wanted to go home.   ADDITIONAL PAST MEDICAL HISTORY:  1. Diabetes. 2. History of hypertension.  3. History of DVTs and pulmonary embolism. 4. Heterozygous factor V Leiden deficiency.   PAST SURGICAL HISTORY: Appendectomy.   ALLERGIES: He has no known drug allergies.   HOME MEDICATIONS:  1. Amlodipine. 2. Carvedilol. 3. Coumadin. 4. Glyburide/metformin. 5. Gabapentin. 6. Lisinopril. 7. Norco.   SOCIAL HISTORY: He smokes two packs per day. He quit alcohol about two years ago.   FAMILY HISTORY: Dementia, ruptured aneurysm, stroke and heart disease.   REVIEW OF SYSTEMS: There are no fevers or chills. There is no chest pain or palpitations. There is no coughing, but he did complain of some shortness of breath. There are no visual or hearing changes. GI symptoms have been described already. He has had some nausea and bouts of vomiting as well. The rest of the review of symptoms is negative.   PHYSICAL EXAMINATION:  GENERAL: The patient is in no acute distress right now.   VITAL SIGNS: He was afebrile. He is on 2 liters of oxygen. The rest of vital signs are stable. He was orthostatic in the Emergency  Room. His lowest blood pressure was 87/59.   HEENT: Normocephalic, atraumatic head. Pupils are equally reactive. Throat was clear.   NECK: Supple.   CARDIAC: Regular rhythm and rate without any murmurs.   LUNGS: Clear bilaterally.   ABDOMEN: Normoactive bowel sounds, soft. I did not appreciate any obvious tenderness anywhere. No hepatomegaly. There are no palpable masses.   EXTREMITIES: No clubbing, cyanosis, or edema.   NEUROLOGIC: Intact although he was a poor historian.   SKIN: Examination is okay.   LABORATORY STUDIES: Labs from admission showed a sodium of 136, potassium 4.1, chloride 102, CO2 23, BUN 96, creatinine 1.2, glucose 381. Liver enzymes were normal. CPK enzymes were normal. White count initially was 10.8. Hemoglobin was 8.8. After blood transfusion his hemoglobin had went up to 9.5. His INR was 4.8 last night. He was given vitamin K. Fortunately, it has dropped to 1.2 this morning.   IMPRESSION/RECOMMENDATIONS: The patient is with melena, vague abdominal pain. The patient is on Coumadin with evidence of coagulopathy. He likely has upper gastrointestinal bleeding with some bleeding ulcers. With his alcohol history, he could have sequela from chronic liver disease such as portal gastropathy and esophageal varices. We will check for that today. He does need to have an endoscopy done. The patient is really reluctant to have it done. However, after the family members convinced him to stay, at this point he will agree to have an endoscopy done later today. The patient is already on Protonix drip and octreotide  drip. We will need to continue at least until the endoscopy is performed. He is already n.p.o. except for meds. We will try to arrange the endoscopy later today unless he changes his mind before then.   Thank you for the referral.   ____________________________ Ezzard StandingPaul Y. Bluford Kaufmannh, MD pyo:ap D: 08/13/2011 15:39:01 ET T: 08/13/2011 16:03:02 ET JOB#: [id number]  cc: Ezzard StandingPaul Y. Bluford Kaufmannh, MD,  <Dictator> Ezzard StandingPAUL Y Nayara Taplin MD ELECTRONICALLY SIGNED 08/14/2011 9:34

## 2014-05-15 NOTE — Consult Note (Signed)
EGD showed gastric ulcer and duodenal ulcers. No visible vessel or adherent clots. Clean bases now. Full liquid. Protonix bid. Resume coumadin slowly.  Electronic Signatures: Lutricia Feilh, Kyvon Hu (MD)  (Signed on 18-Jul-13 15:32)  Authored  Last Updated: 18-Jul-13 15:32 by Lutricia Feilh, Quintara Bost (MD)
